# Patient Record
Sex: Female | Born: 1980 | Race: Black or African American | Hispanic: No | Marital: Single | Smoking: Never smoker
Health system: Southern US, Community
[De-identification: ages and names within clinical notes are randomized; demographics above are authoritative.]

## PROBLEM LIST (undated history)

## (undated) DIAGNOSIS — F32A Depression, unspecified: Secondary | ICD-10-CM

## (undated) DIAGNOSIS — Z8669 Personal history of other diseases of the nervous system and sense organs: Secondary | ICD-10-CM

## (undated) DIAGNOSIS — T7840XA Allergy, unspecified, initial encounter: Secondary | ICD-10-CM

## (undated) DIAGNOSIS — F329 Major depressive disorder, single episode, unspecified: Secondary | ICD-10-CM

## (undated) HISTORY — DX: Depression, unspecified: F32.A

## (undated) HISTORY — DX: Major depressive disorder, single episode, unspecified: F32.9

## (undated) HISTORY — DX: Personal history of other diseases of the nervous system and sense organs: Z86.69

## (undated) HISTORY — DX: Allergy, unspecified, initial encounter: T78.40XA

---

## 1998-10-06 ENCOUNTER — Ambulatory Visit (HOSPITAL_COMMUNITY): Admission: RE | Admit: 1998-10-06 | Discharge: 1998-10-06 | Payer: Self-pay | Admitting: Orthopedic Surgery

## 1999-05-23 HISTORY — PX: KNEE DISLOCATION SURGERY: SHX689

## 2010-01-13 ENCOUNTER — Emergency Department (HOSPITAL_BASED_OUTPATIENT_CLINIC_OR_DEPARTMENT_OTHER): Admission: EM | Admit: 2010-01-13 | Discharge: 2010-01-13 | Payer: Self-pay | Admitting: Emergency Medicine

## 2010-04-25 ENCOUNTER — Ambulatory Visit
Admission: RE | Admit: 2010-04-25 | Discharge: 2010-04-25 | Payer: Self-pay | Source: Home / Self Care | Admitting: Orthopedic Surgery

## 2010-05-22 HISTORY — PX: SHOULDER SURGERY: SHX246

## 2010-12-01 ENCOUNTER — Other Ambulatory Visit: Payer: Self-pay | Admitting: Cardiology

## 2010-12-01 ENCOUNTER — Encounter: Payer: Self-pay | Admitting: Cardiology

## 2010-12-01 DIAGNOSIS — R9439 Abnormal result of other cardiovascular function study: Secondary | ICD-10-CM

## 2010-12-21 ENCOUNTER — Ambulatory Visit (HOSPITAL_COMMUNITY)
Admission: RE | Admit: 2010-12-21 | Discharge: 2010-12-21 | Disposition: A | Discharge status: Home / Self Care | Payer: BC Managed Care – PPO | Source: Ambulatory Visit | Attending: Cardiology | Admitting: Cardiology

## 2010-12-21 DIAGNOSIS — R9431 Abnormal electrocardiogram [ECG] [EKG]: Secondary | ICD-10-CM

## 2010-12-21 DIAGNOSIS — R9439 Abnormal result of other cardiovascular function study: Secondary | ICD-10-CM | POA: Insufficient documentation

## 2010-12-21 MED ORDER — IOHEXOL 350 MG/ML SOLN: 80.0000 mL | Freq: Once | INTRAVENOUS | Status: AC | PRN

## 2012-08-14 ENCOUNTER — Ambulatory Visit (HOSPITAL_BASED_OUTPATIENT_CLINIC_OR_DEPARTMENT_OTHER)
Admission: RE | Admit: 2012-08-14 | Discharge: 2012-08-14 | Disposition: A | Discharge status: Home / Self Care | Payer: BC Managed Care – PPO | Source: Ambulatory Visit | Attending: Family | Admitting: Family

## 2012-08-14 ENCOUNTER — Ambulatory Visit (INDEPENDENT_AMBULATORY_CARE_PROVIDER_SITE_OTHER): Payer: BC Managed Care – PPO | Admitting: Family

## 2012-08-14 ENCOUNTER — Encounter: Payer: Self-pay | Admitting: Family

## 2012-08-14 VITALS — BP 100/78 | HR 103 | Temp 98.9°F | Resp 16 | Ht 66.0 in | Wt 167.0 lb

## 2012-08-14 DIAGNOSIS — G43909 Migraine, unspecified, not intractable, without status migrainosus: Secondary | ICD-10-CM | POA: Insufficient documentation

## 2012-08-14 DIAGNOSIS — Z889 Allergy status to unspecified drugs, medicaments and biological substances status: Secondary | ICD-10-CM | POA: Insufficient documentation

## 2012-08-14 DIAGNOSIS — M25569 Pain in unspecified knee: Secondary | ICD-10-CM

## 2012-08-14 DIAGNOSIS — Z9109 Other allergy status, other than to drugs and biological substances: Secondary | ICD-10-CM

## 2012-08-14 DIAGNOSIS — M25562 Pain in left knee: Secondary | ICD-10-CM

## 2012-08-14 LAB — SEDIMENTATION RATE: Sed Rate: 12 mm/hr (ref 0–22)

## 2012-08-14 LAB — RHEUMATOID FACTOR: Rhuematoid fact SerPl-aCnc: <10 IU/mL (ref <=14)

## 2012-08-14 MED ORDER — MELOXICAM 7.5 MG PO TABS: 7.5000 mg | ORAL_TABLET | Freq: Every day | ORAL | Status: DC

## 2012-08-14 NOTE — Patient Instructions (Addendum)
Please complete your lab work prior to leaving.  Follow up in 3 weeks for fasting physical. Welcome to [facility]!

## 2012-08-14 NOTE — Assessment & Plan Note (Signed)
Recommended ice to left knee bid, trial of meloxicam.  Obtain plain films of knees, obtain RA/ANA/ESR to evaluate for underlying autoimmune etiology. If no improvement with these measures, plan referral to ortho.

## 2012-08-14 NOTE — Assessment & Plan Note (Signed)
Stable with prn benadryl.

## 2012-08-14 NOTE — Assessment & Plan Note (Signed)
Stable, managed by Dr. Hyacinth Meeker- neurology.

## 2012-08-14 NOTE — Progress Notes (Signed)
Subjective:    Patient ID: Brandi Ashley, female    DOB: 1980/06/28, 32 y.o.   MRN: [id number]  HPI  Bilateral knee pain- started 2 months ago.  She has tried otc Aleve, otc pain gels/patches.  Aleve helps for a "couple hours." Pain wakes her up int he middle of the night. Has to rol out of bed to get out of the bed.  Mild shoulder pain- but reports right shoulder arthroscopy several years ago.  She also reports hx of dislocated knee cap with surgery right knee in 2001.  She slipped and fell on a wet floor. She reports that she has some associated left knee swelling.  Denies associated fevers.  She reports that she has a cousin with SLE.   Depression- reports that this was during her pregnancy. Had some issues with her husband's dad. She was on wellbutrin for >1 year but has been off x 1 year.  Reports that she is not having crying spells like she used to.     Seasonal allergies- uses benadryl prn HS.    Migraines- Reports that her migraines are a few times a month.  She is treated by Dr. Hyacinth Meeker Neurology. She is on nortriptyline and topamax for prophylaxis and thorazine prn.    Review of Systems  Constitutional: Negative for unexpected weight change.  HENT: Negative for congestion.   Eyes: Negative for visual disturbance.  Respiratory: Negative for cough.   Cardiovascular: Negative for leg swelling.  Gastrointestinal: Negative for nausea, vomiting and diarrhea.  Genitourinary: Negative for dysuria and frequency.  Musculoskeletal: Positive for arthralgias.  Skin: Negative for rash.  Neurological: Positive for headaches.  Hematological: Negative for adenopathy.  Psychiatric/Behavioral:       See HPI   Past Medical History  Diagnosis Date  . Depression   . Allergy   . History of migraine     History   Social History  . Marital Status: Single    Spouse Name: N/A    Number of Children: 1  . Years of Education: N/A   Occupational History  .     Social History Main Topics   . Smoking status: Never Smoker   . Smokeless tobacco: Never Used  . Alcohol Use: Yes     Comment: < 1 a week; drinks socially  . Drug Use: Not on file  . Sexually Active: Not on file   Other Topics Concern  . 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 Shoulder surgery Right 2012    arthroscopy  . Knee dislocation surgery Right 2001    Family History  Problem Relation Age of Onset  . Hyperlipidemia Mother   . Hypertension Mother   . Diabetes Mother     type II  . Hyperlipidemia Father   . Hypertension Father   . Cancer Maternal Grandmother 43    breast  . Diabetes Maternal Grandmother   . Hypertension Maternal Grandmother   . Hyperlipidemia Maternal Grandmother   . Arthritis Maternal Grandmother   . Osteoporosis Maternal Grandmother     No Known Allergies  No current outpatient prescriptions on file prior to visit.   No current facility-administered medications on file prior to visit.    BP 100/78  Pulse 103  Temp(Src) 98.9 F (37.2 C) (Oral)  Resp 16  Ht 5\' 6"  (1.676 m)  Wt 167 lb (75.751 kg)  BMI 26.97 kg/m2  SpO2 99%  LMP 05/22/2008       Objective:   Physical Exam  Constitutional: She is oriented to person, place, and time. She appears well-developed and well-nourished. No distress.  HENT:  Head: Normocephalic and atraumatic.  Cardiovascular: Normal rate and regular rhythm.   No murmur heard. Pulmonary/Chest: Effort normal and breath sounds normal. No respiratory distress. She has no wheezes. She has no rales. She exhibits no tenderness.  Musculoskeletal: She exhibits no edema.  Small L knee effusion is noted.  Lymphadenopathy:    She has no cervical adenopathy.  Neurological: She is alert and oriented to person, place, and time.   Skin: Skin is warm and dry.  Psychiatric: She has a normal mood and affect. Her behavior is normal. Judgment and thought content normal.          Assessment & Plan:

## 2012-08-15 LAB — ANTI-NUCLEAR AB-TITER (ANA TITER)

## 2012-08-21 ENCOUNTER — Telehealth: Payer: Self-pay | Admitting: Family

## 2012-08-21 DIAGNOSIS — R768 Other specified abnormal immunological findings in serum: Secondary | ICD-10-CM

## 2012-08-21 NOTE — Telephone Encounter (Signed)
Reviewed x rays and lab work.  X rays normal. ANA + will refer to rheumatology. Reviewed with pt.  She is agreeable to proceed with referral.

## 2012-08-22 ENCOUNTER — Other Ambulatory Visit: Payer: Self-pay | Admitting: Family

## 2012-09-10 ENCOUNTER — Ambulatory Visit (INDEPENDENT_AMBULATORY_CARE_PROVIDER_SITE_OTHER): Payer: BC Managed Care – PPO | Admitting: Family

## 2012-09-10 ENCOUNTER — Encounter: Payer: Self-pay | Admitting: Family

## 2012-09-10 VITALS — BP 100/70 | HR 84 | Temp 98.6°F | Resp 14 | Ht 66.0 in | Wt 161.1 lb

## 2012-09-10 DIAGNOSIS — R21 Rash and other nonspecific skin eruption: Secondary | ICD-10-CM

## 2012-09-10 DIAGNOSIS — Z23 Encounter for immunization: Secondary | ICD-10-CM

## 2012-09-10 DIAGNOSIS — Z0000 Encounter for general adult medical examination without abnormal findings: Secondary | ICD-10-CM

## 2012-09-10 LAB — HEPATIC FUNCTION PANEL
Bilirubin, Direct: 0.1 mg/dL (ref 0.0–0.3)
Total Bilirubin: 0.3 mg/dL (ref 0.3–1.2)

## 2012-09-10 LAB — BASIC METABOLIC PANEL WITH GFR
Calcium: 10.6 mg/dL — ABNORMAL HIGH (ref 8.4–10.5)
GFR, Est African American: >89 mL/min
GFR, Est Non African American: 81 mL/min
Sodium: 139 mEq/L (ref 135–145)

## 2012-09-10 LAB — CBC WITH DIFFERENTIAL/PLATELET
Eosinophils Relative: 1 % (ref 0–5)
HCT: 38 % (ref 36.0–46.0)
Hemoglobin: 12.1 g/dL (ref 12.0–15.0)
Lymphocytes Relative: 34 % (ref 12–46)
Lymphs Abs: 2.9 10*3/uL (ref 0.7–4.0)
MCV: 82.8 fL (ref 78.0–100.0)
Monocytes Absolute: 0.4 10*3/uL (ref 0.1–1.0)
Platelets: 295 10*3/uL (ref 150–400)
RBC: 4.59 MIL/uL (ref 3.87–5.11)
WBC: 8.6 10*3/uL (ref 4.0–10.5)

## 2012-09-10 LAB — TSH: TSH: 0.639 u[IU]/mL (ref 0.350–4.500)

## 2012-09-10 LAB — LIPID PANEL
HDL: 45 mg/dL (ref >39)
Total CHOL/HDL Ratio: 3 Ratio

## 2012-09-10 MED ORDER — BETAMETHASONE DIPROPIONATE 0.05 % EX CREA: TOPICAL_CREAM | Freq: Two times a day (BID) | CUTANEOUS | Status: AC

## 2012-09-10 NOTE — Assessment & Plan Note (Signed)
Continue healthy diet. Recommended goal exercise 30 minutes 5 days a week. Obtain fasting labs. Tdap today.

## 2012-09-10 NOTE — Patient Instructions (Addendum)
Please complete lab work prior to leaving. Follow up in 3 months, sooner if problems/concerns.  

## 2012-09-10 NOTE — Addendum Note (Signed)
Addended by: Mervin Kung A on: 09/10/2012 06:28 PM   Modules accepted: Orders

## 2012-09-10 NOTE — Assessment & Plan Note (Signed)
Recommended benadryl prn. Add diprolene cream for itching.  Advised pt to got to ER if tongue/lip swelling or SOB occurs.  I remain suspicious that she may have SLE given joint swelling, rash and + ANA.  I have advised her to keep her upcoming appointment with rheumatology.

## 2012-09-10 NOTE — Progress Notes (Signed)
Subjective:    Patient ID: Brandi Ashley, female    DOB: 02-15-1981, 32 y.o.   MRN: [id number]  HPI  Patient presents today for complete physical.  Immunizations:due Diet: trying to eat healthy Exercise:  Walks twice a week x 30 minutes Pap Smear: 05/2012  She continues to have knee pain/knee swelling, but now the right knee is affected. She is also complaining of bilateral shoulder pain.  Review of Systems  Constitutional: Negative for unexpected weight change.  HENT: Positive for congestion.   Eyes: Negative for visual disturbance.  Respiratory: Negative for cough and shortness of breath.   Cardiovascular: Negative for chest pain.  Gastrointestinal: Negative for nausea, vomiting and diarrhea.  Skin: Positive for rash.  Neurological: Negative for headaches.  Hematological: Negative for adenopathy.  Psychiatric/Behavioral:       Denies depression   Past Medical History  Diagnosis Date  . Depression   . Allergy   . History of migraine     History   Social History  . Marital Status: Single    Spouse Name: N/A    Number of Children: 1  . Years of Education: N/A   Occupational History  .     Social History Main Topics  . Smoking status: Never Smoker   . Smokeless tobacco: Never Used  . Alcohol Use: Yes     Comment: < 1 a week; drinks socially  . Drug Use: Not on file  . Sexually Active: Not on file   Other Topics Concern  . 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 Shoulder surgery Right 2012    arthroscopy  . Knee dislocation surgery Right 2001    Family History  Problem Relation Age of Onset  . Hyperlipidemia Mother   . Hypertension Mother   . Diabetes Mother     type II  . Hyperlipidemia Father   . Hypertension Father   . Cancer  Maternal Grandmother 74    breast  . Diabetes Maternal Grandmother   . Hypertension Maternal Grandmother   . Hyperlipidemia Maternal Grandmother   . Arthritis Maternal Grandmother   . Osteoporosis Maternal Grandmother     No Known Allergies  Current Outpatient Prescriptions on File Prior to Visit  Medication Sig Dispense Refill  . chlorproMAZINE (THORAZINE) 25 MG tablet Take 25 mg by mouth every 6 (six) hours as needed. For migraines and n/v      . meloxicam (MOBIC) 7.5 MG tablet TAKE 1 TABLET BY MOUTH DAILY  30 tablet  0  . nortriptyline (PAMELOR) 50 MG capsule Take 50 mg by mouth at bedtime.      . topiramate (TOPAMAX) 50 MG tablet Take 2 tablets in the morning and 3 tablets in the evening.       No current facility-administered medications on file prior to visit.    BP 100/70  Pulse 84  Temp(Src) 98.6 F (37 C) (Oral)  Resp 14  Ht 5\' 6"  (1.676 m)  Wt 161 lb 1.9 oz (73.084 kg)  BMI 26.02 kg/m2  SpO2 99%  LMP 05/22/2008       Objective:   Physical Exam Physical Exam  Constitutional: She is oriented to person, place, and time. She appears well-developed and well-nourished. No distress.  HENT:  Head: Normocephalic and atraumatic.  Right Ear: Tympanic membrane and ear canal normal.  Left Ear: Tympanic membrane and ear canal normal.  Mouth/Throat: Oropharynx is clear and moist.  Eyes: Pupils are equal, round, and reactive to light. No scleral icterus.  Neck: Normal range of motion. No thyromegaly present.  Cardiovascular: Normal rate and regular rhythm.   No murmur heard. Pulmonary/Chest: Effort normal and breath sounds normal. No respiratory distress. He has no wheezes. She has no rales. She exhibits no tenderness.  Abdominal: Soft. Bowel sounds are normal. He exhibits no distension and no mass. There is no tenderness. There is no rebound and no guarding.  Musculoskeletal: She exhibits bilateral knee effusion and mild tenderness to palpation of bilateral  shoulders. Lymph:   She has no cervical adenopathy.  Neurological: She is alert and oriented to person, place, and time.  She exhibits normal muscle tone. Coordination normal.  Skin: Skin is warm and dry. she has raised round erythematous lesions noted on bilateral forearms Psychiatric: She has a normal mood and affect. Her behavior is normal. Judgment and thought content normal.  Breasts: Examined lying Right: Without masses, retractions, discharge or axillary adenopathy.  Left: Without masses, retractions, discharge or axillary adenopathy.  Inguinal/mons: Normal without inguinal adenopathy           Assessment & Plan:          Assessment & Plan:

## 2012-09-11 ENCOUNTER — Telehealth: Payer: Self-pay | Admitting: Family

## 2012-09-11 LAB — URINALYSIS, ROUTINE W REFLEX MICROSCOPIC
Hgb urine dipstick: NEGATIVE
Ketones, ur: NEGATIVE mg/dL
Nitrite: NEGATIVE
pH: 6 (ref 5.0–8.0)

## 2012-09-11 NOTE — Telephone Encounter (Signed)
LMOM with contact name and number for return call RE: results and further provider instructions/SLS  

## 2012-09-11 NOTE — Telephone Encounter (Signed)
Pls let pt know that her calcium is mildly elevated. Please have her return to lab in 1 month for inonized calcium.  Dx is hypercalcemia.

## 2012-09-12 NOTE — Telephone Encounter (Signed)
Notified pt and she voices understanding. Future order placed.

## 2012-11-12 ENCOUNTER — Ambulatory Visit (HOSPITAL_BASED_OUTPATIENT_CLINIC_OR_DEPARTMENT_OTHER)
Admission: RE | Admit: 2012-11-12 | Discharge: 2012-11-12 | Disposition: A | Discharge status: Home / Self Care | Payer: BC Managed Care – PPO | Source: Ambulatory Visit | Attending: Emergency Medicine | Admitting: Emergency Medicine

## 2012-11-12 ENCOUNTER — Other Ambulatory Visit (HOSPITAL_BASED_OUTPATIENT_CLINIC_OR_DEPARTMENT_OTHER): Payer: Self-pay | Admitting: *Deleted

## 2012-11-12 ENCOUNTER — Other Ambulatory Visit (HOSPITAL_BASED_OUTPATIENT_CLINIC_OR_DEPARTMENT_OTHER): Payer: Self-pay | Admitting: Osteopathic Medicine

## 2012-11-12 DIAGNOSIS — R1013 Epigastric pain: Secondary | ICD-10-CM

## 2012-11-12 DIAGNOSIS — R109 Unspecified abdominal pain: Secondary | ICD-10-CM

## 2012-11-12 DIAGNOSIS — R1011 Right upper quadrant pain: Secondary | ICD-10-CM

## 2012-11-12 DIAGNOSIS — R110 Nausea: Secondary | ICD-10-CM

## 2012-12-09 ENCOUNTER — Ambulatory Visit: Payer: BC Managed Care – PPO | Admitting: Family

## 2013-02-11 ENCOUNTER — Ambulatory Visit: Payer: BC Managed Care – PPO | Admitting: Family

## 2013-03-10 ENCOUNTER — Ambulatory Visit: Payer: BC Managed Care – PPO | Admitting: Family

## 2013-04-15 ENCOUNTER — Ambulatory Visit: Payer: BC Managed Care – PPO | Admitting: Family

## 2013-05-19 IMAGING — CR DG KNEE COMPLETE 4+V*L*
4 series · 4 of 4 positions shown · non-contrast
Comparison: None.

CLINICAL DATA: Left knee pain.  Injury 2 months ago.

LEFT KNEE - COMPLETE 4+ VIEW

[t knee ap left]
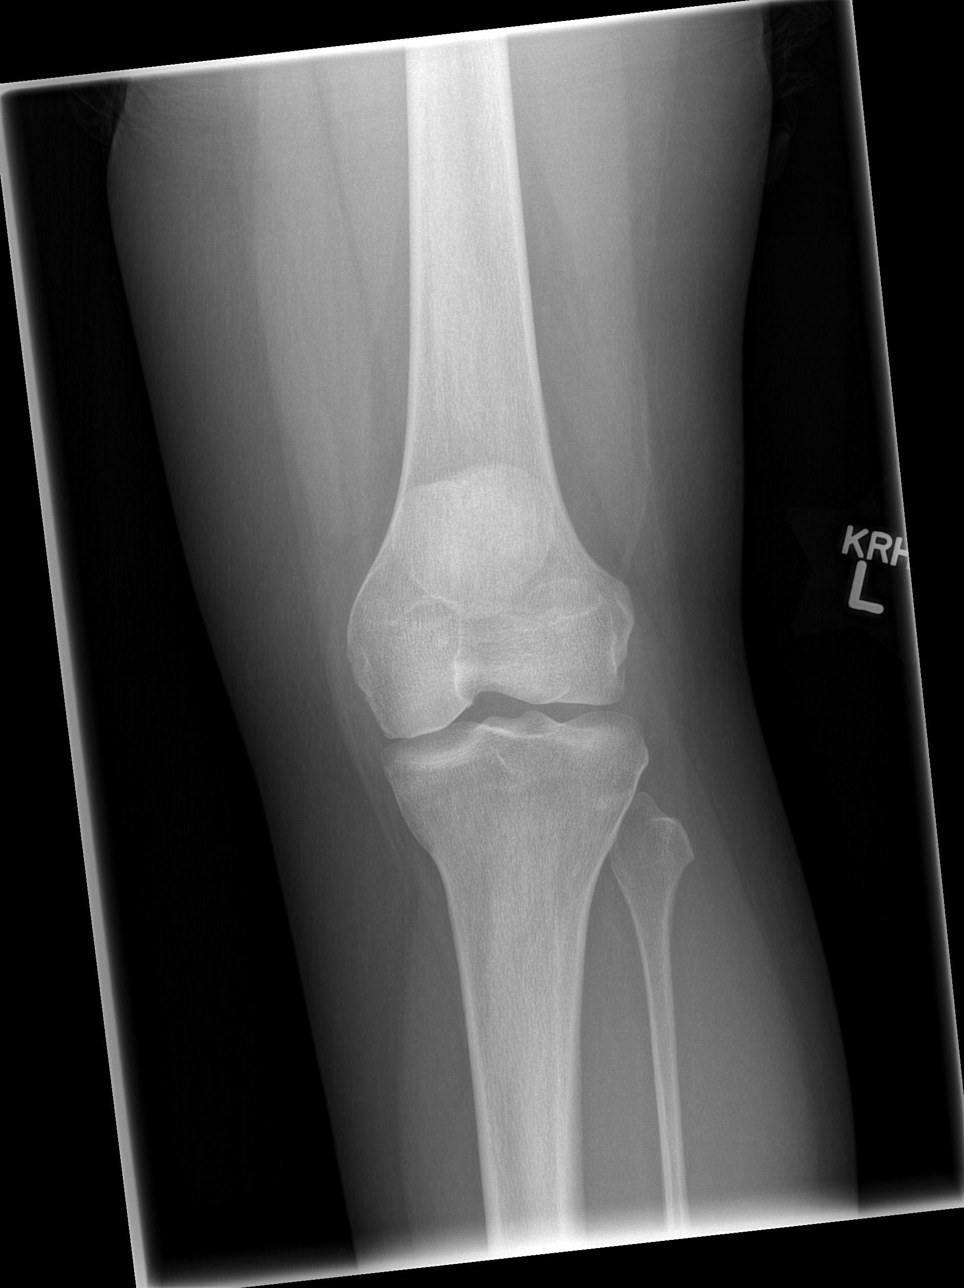

[t knee oblique left (1 of 2)]
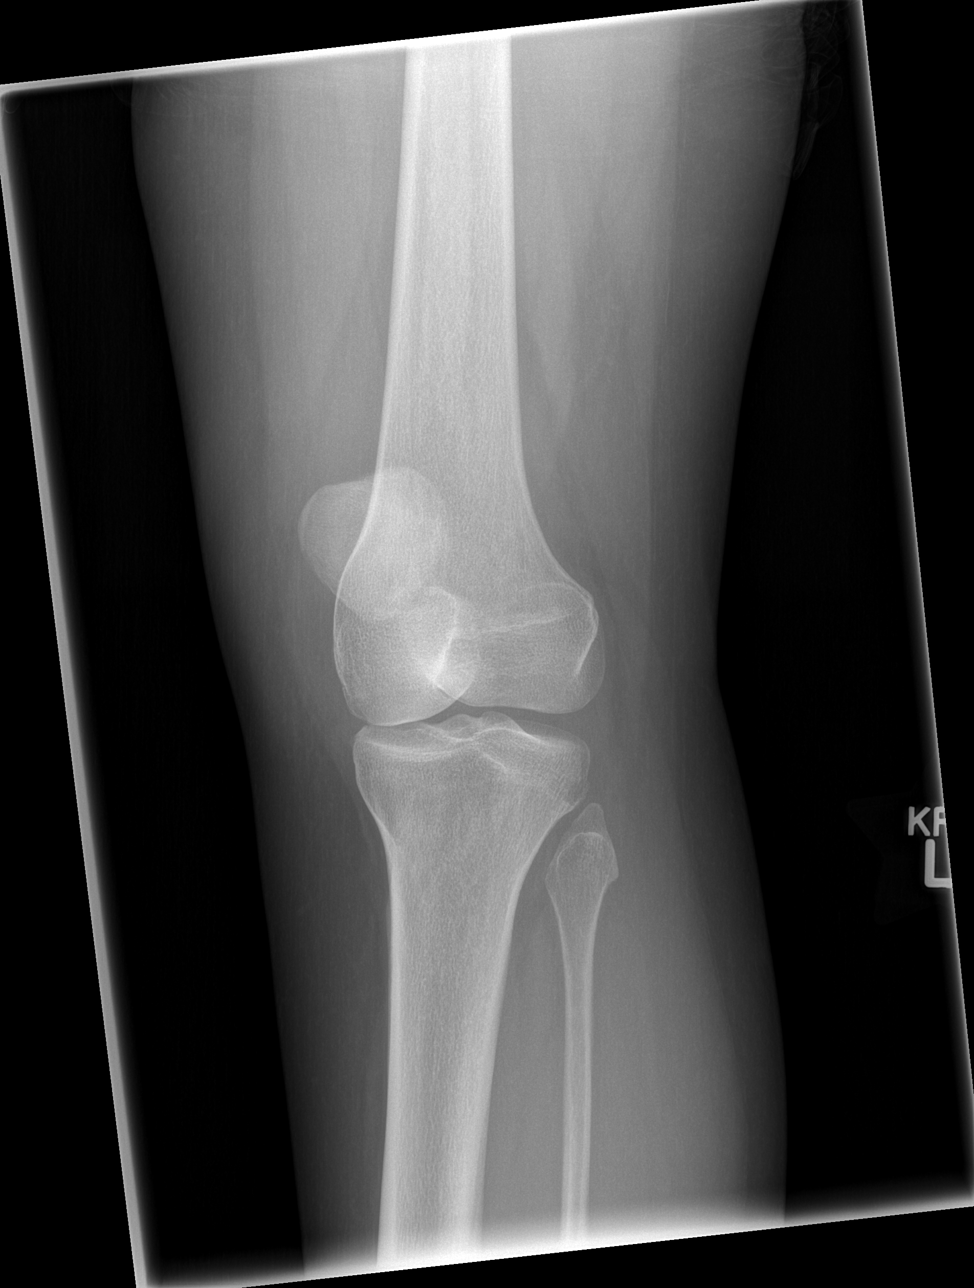

[t knee oblique left (2 of 2)]
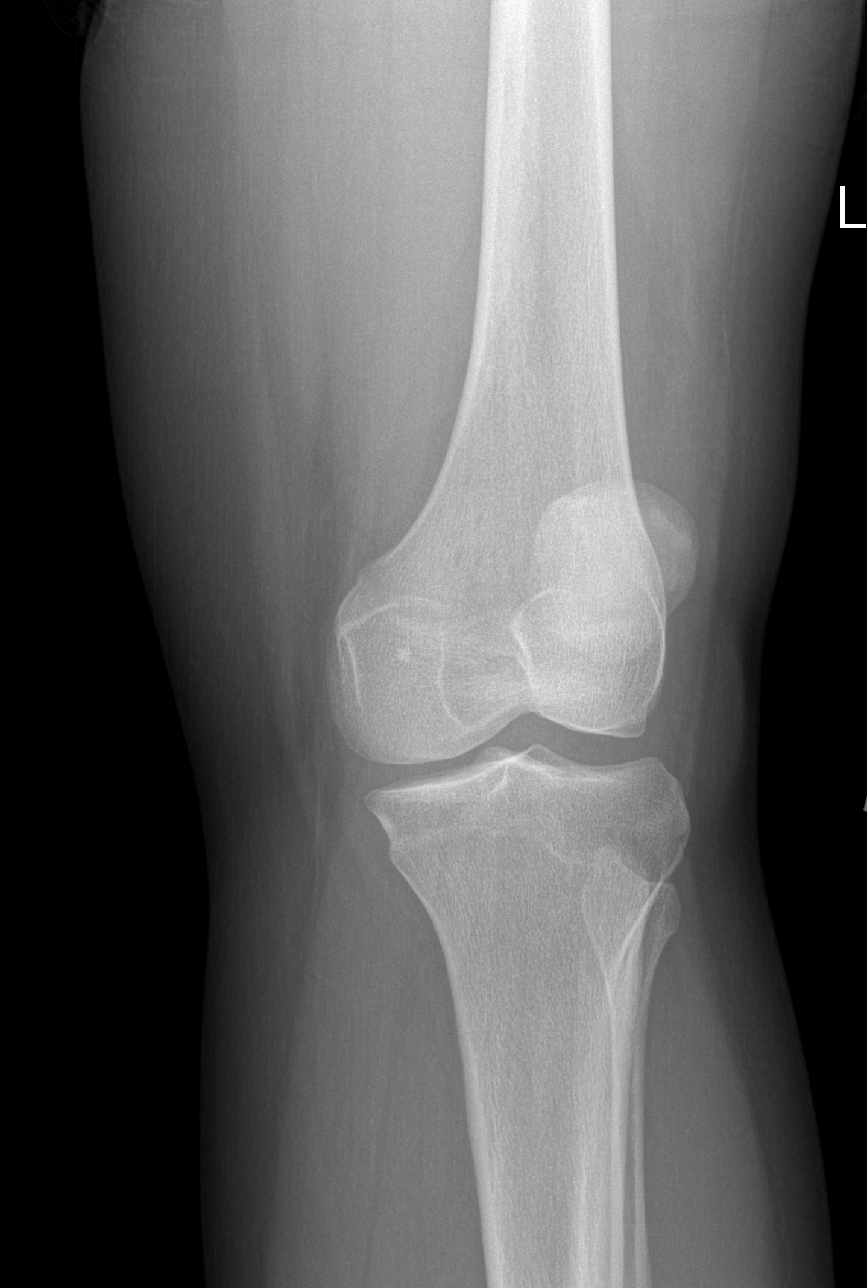

[t knee lat left]
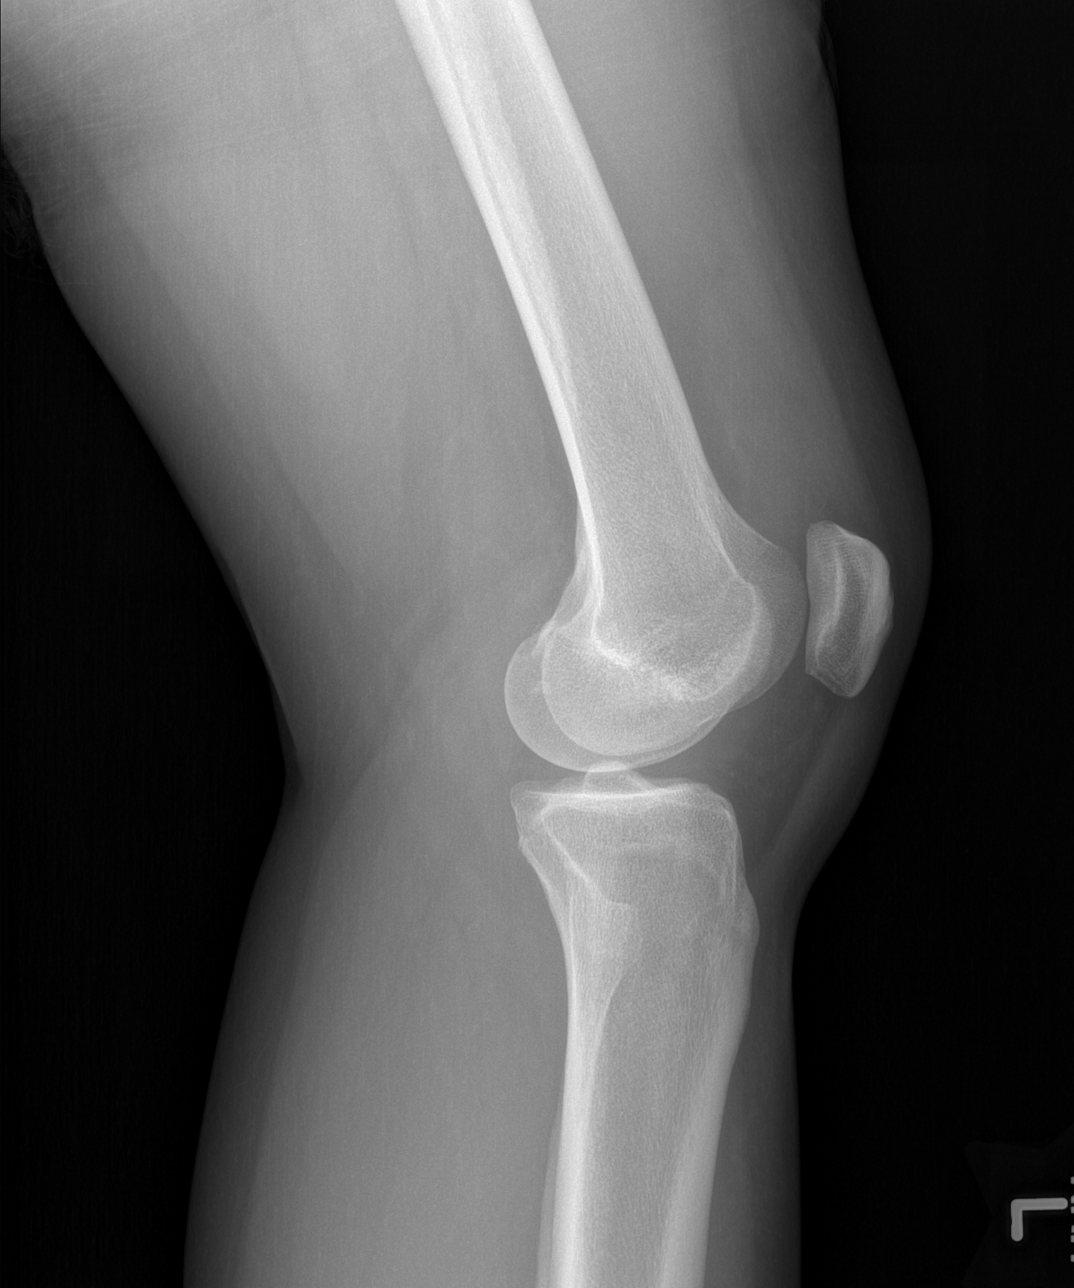

[4 of 4 positions shown; findings below may reference images not displayed]

FINDINGS: Imaged bones, joints and soft tissues appear normal.
IMPRESSION: Negative exam.

## 2013-06-16 ENCOUNTER — Ambulatory Visit: Payer: Self-pay | Admitting: Family

## 2013-07-14 ENCOUNTER — Ambulatory Visit: Payer: Self-pay | Admitting: Family

## 2017-07-15 ENCOUNTER — Encounter (HOSPITAL_COMMUNITY): Payer: Self-pay

## 2017-07-15 ENCOUNTER — Other Ambulatory Visit: Payer: Self-pay

## 2017-07-15 ENCOUNTER — Emergency Department (HOSPITAL_COMMUNITY)
Admission: EM | Admit: 2017-07-15 | Discharge: 2017-07-15 | Disposition: A | Discharge status: Home / Self Care | Payer: Self-pay | Attending: Emergency Medicine | Admitting: Emergency Medicine

## 2017-07-15 DIAGNOSIS — F10920 Alcohol use, unspecified with intoxication, uncomplicated: Secondary | ICD-10-CM

## 2017-07-15 DIAGNOSIS — R112 Nausea with vomiting, unspecified: Secondary | ICD-10-CM

## 2017-07-15 DIAGNOSIS — F10220 Alcohol dependence with intoxication, uncomplicated: Secondary | ICD-10-CM | POA: Insufficient documentation

## 2017-07-15 DIAGNOSIS — Z79899 Other long term (current) drug therapy: Secondary | ICD-10-CM | POA: Insufficient documentation

## 2017-07-15 MED ORDER — ONDANSETRON 4 MG PO TBDP: 4.0000 mg | ORAL_TABLET | Freq: Three times a day (TID) | ORAL | 0 refills | Status: AC | PRN

## 2017-07-15 NOTE — ED Notes (Signed)
Pt given ginger ale and pt family at bedside.

## 2017-07-15 NOTE — ED Triage Notes (Signed)
Pt BIB [facility]. She reports drinking "2 drinks and some shots" tonight. EMS picked [place] up [place] [employer] where she had vomited and passed out into her food. She is currently responsive to verbal stimuli. EMS gave her 4mg  Zofran en route.

## 2017-07-15 NOTE — ED Notes (Signed)
Bed: [facility] Expected date:  Expected time:  Means of arrival:  Comments: 

## 2017-07-15 NOTE — ED Provider Notes (Signed)
[facility]-EMERGENCY DEPT Provider Note   CSN: [id number] Arrival date & time: 07/15/17  4098     History   Chief Complaint Chief Complaint  Patient presents with  . Alcohol Intoxication    HPI Brandi Ashley is a 37 y.o. female.  HPI  This is a 37 year old female with no significant past medical history who presents with alcohol intoxication.  She has friends at the bedside.  They provide most of the history.  Reports that she had 3-4 alcoholic beverages tonight.  They were eating [employer] when she began to vomit.  She did not pass out.  On my exam, patient is very somnolent but arousable.  She does not contribute much to history taking.  She does deny any pain at this time.  Level 5 caveat for acute intoxication.  Past Medical History:  Diagnosis Date  . Allergy   . Depression   . History of migraine     Patient Active Problem List   Diagnosis Date Noted  . Rash and nonspecific skin eruption 09/10/2012  . Routine general medical examination at a health care facility 09/10/2012  . Knee pain, bilateral 08/14/2012  . H/O seasonal allergies 08/14/2012  . Migraine, unspecified, without mention of intractable migraine without mention of status migrainosus 08/14/2012    Past Surgical History:  Procedure Laterality Date  . KNEE DISLOCATION SURGERY Right 2001  . SHOULDER SURGERY Right 2012   arthroscopy    OB History    No data available       Home Medications    Prior to Admission medications   Medication Sig Start Date End Date Taking? Authorizing Provider  betamethasone dipropionate (DIPROLENE) 0.05 % cream Apply topically 2 (two) times daily. 09/10/12   Sandford Craze, NP  chlorproMAZINE (THORAZINE) 25 MG tablet Take 25 mg by mouth every 6 (six) hours as needed. For migraines and n/v    [provider]  meloxicam (MOBIC) 7.5 MG tablet TAKE 1 TABLET BY MOUTH DAILY 08/22/12   Sandford Craze, NP  nortriptyline (PAMELOR)  50 MG capsule Take 50 mg by mouth at bedtime.    [provider]  ondansetron (ZOFRAN ODT) 4 MG disintegrating tablet Take 1 tablet (4 mg total) by mouth every 8 (eight) hours as needed for nausea or vomiting. 07/15/17   Antonea Gaut, Mayer Masker, MD  topiramate (TOPAMAX) 50 MG tablet Take 2 tablets in the morning and 3 tablets in the evening.    [provider]    Family History Family History  Problem Relation Age of Onset  . Cancer Maternal Grandmother 53       breast  . Diabetes Maternal Grandmother   . Hypertension Maternal Grandmother   . Hyperlipidemia Maternal Grandmother   . Arthritis Maternal Grandmother   . Osteoporosis Maternal Grandmother   . Hyperlipidemia Mother   . Hypertension Mother   . Diabetes Mother        type II  . Hyperlipidemia Father   . Hypertension Father     Social History Social History   Tobacco Use  . Smoking status: Never Smoker  . Smokeless tobacco: Never Used  Substance Use Topics  . Alcohol use: Yes    Comment: < 1 a week; drinks socially  . Drug use: Not on file     Allergies   Patient has no known allergies.   Review of Systems Review of Systems  Unable to perform ROS: Other  Intoxication   Physical Exam Updated Vital Signs BP  125/81 (BP Location: Left Arm)   Pulse 88   Temp 98.9 F (37.2 C) (Oral)   Resp 16   SpO2 100%   Physical Exam  Constitutional: She appears well-developed and well-nourished.  Obese, somnolent but arousable  HENT:  Head: Normocephalic and atraumatic.  Mucous membranes dry  Eyes: Pupils are equal, round, and reactive to light.  Cardiovascular: Normal rate, regular rhythm and normal heart sounds.  Pulmonary/Chest: Effort normal and breath sounds normal. No respiratory distress. She has no wheezes.  Abdominal: Soft. Bowel sounds are normal. There is no tenderness.  Neurological:  Somnolent but arousable, moves all 4 extremities, follows simple commands  Skin: Skin is warm and dry.    Psychiatric: She has a normal mood and affect.  Nursing note and vitals reviewed.    ED Treatments / Results  Labs (all labs ordered are listed, but only abnormal results are displayed) Labs Reviewed - No data to display  EKG  EKG Interpretation None       Radiology No results found.  Procedures Procedures (including critical care time)  Medications Ordered in ED Medications - No data to display   Initial Impression / Assessment and Plan / ED Course  I have reviewed the triage vital signs and the nursing notes.  Pertinent labs & imaging results that were available during my care of the patient were reviewed by me and considered in my medical decision making (see chart for details).     [facility]  She is somnolent but arousable.  Reported alcohol use.  She has dried vomit in her hair.  Her vital signs are reassuring.  She is moving all 4 extremities and nonfocal.  Will defer any workup and allow her to metabolize.  She has friends at bedside and when she is able to ambulate independently, she can be discharged home.  Doubt acute emergent process.  5:29 AM Patient ambulatory.  Will discharge to the care of her friends.  After history, exam, and medical workup I feel the patient has been appropriately medically screened and is safe for discharge home. Pertinent diagnoses were discussed with the patient. Patient was given return precautions.   Final Clinical Impressions(s) / ED Diagnoses   Final diagnoses:  Alcoholic intoxication without complication (HCC)  Non-intractable vomiting with nausea, unspecified vomiting type    ED Discharge Orders        Ordered    ondansetron (ZOFRAN ODT) 4 MG disintegrating tablet  Every 8 hours PRN     07/15/17 0528       Shon BatonHorton, Quisha Mabie F, MD 07/15/17 0530

## 2018-07-22 ENCOUNTER — Other Ambulatory Visit (HOSPITAL_BASED_OUTPATIENT_CLINIC_OR_DEPARTMENT_OTHER): Payer: Self-pay | Admitting: Physician Assistant

## 2018-07-22 DIAGNOSIS — Z1231 Encounter for screening mammogram for malignant neoplasm of breast: Secondary | ICD-10-CM

## 2019-11-27 ENCOUNTER — Other Ambulatory Visit (HOSPITAL_BASED_OUTPATIENT_CLINIC_OR_DEPARTMENT_OTHER): Payer: Self-pay | Admitting: Physician Assistant

## 2019-11-27 DIAGNOSIS — E049 Nontoxic goiter, unspecified: Secondary | ICD-10-CM

## 2019-12-08 ENCOUNTER — Ambulatory Visit (HOSPITAL_BASED_OUTPATIENT_CLINIC_OR_DEPARTMENT_OTHER): Payer: BC Managed Care – PPO

## 2020-01-20 ENCOUNTER — Ambulatory Visit (HOSPITAL_BASED_OUTPATIENT_CLINIC_OR_DEPARTMENT_OTHER): Admission: RE | Admit: 2020-01-20 | Payer: BC Managed Care – PPO | Source: Ambulatory Visit

## 2023-10-01 ENCOUNTER — Other Ambulatory Visit (HOSPITAL_COMMUNITY): Payer: Self-pay
